# Patient Record
Sex: Male | Born: 1980 | Race: White | Hispanic: No | Marital: Married | Smoking: Never smoker
Health system: Southern US, Community
[De-identification: ages and names within clinical notes are randomized; demographics above are authoritative.]

---

## 2010-11-12 HISTORY — PX: VASECTOMY: SHX75

## 2011-08-17 ENCOUNTER — Ambulatory Visit: Payer: Self-pay | Admitting: Family Medicine

## 2011-10-06 ENCOUNTER — Ambulatory Visit: Payer: Self-pay | Admitting: Unknown Physician Specialty

## 2013-01-08 IMAGING — CR ORBITS FOR FOREIGN BODY - 2 VIEW
1 series · 3 of 3 positions shown · non-contrast
Comparison: none

REASON FOR EXAM: Hx of metal in eye - screen for residual fragments prior
to MRI
COMMENTS:

PROCEDURE:     DXR - DXR ORBITS FOR MRI CLEARANCE  - October 06, 2011  [DATE]
RESULT:     Comparison: None.

[Series 1: pa waters · 0.17mm/px · 3 of 3 slices shown]
[im 1/3]
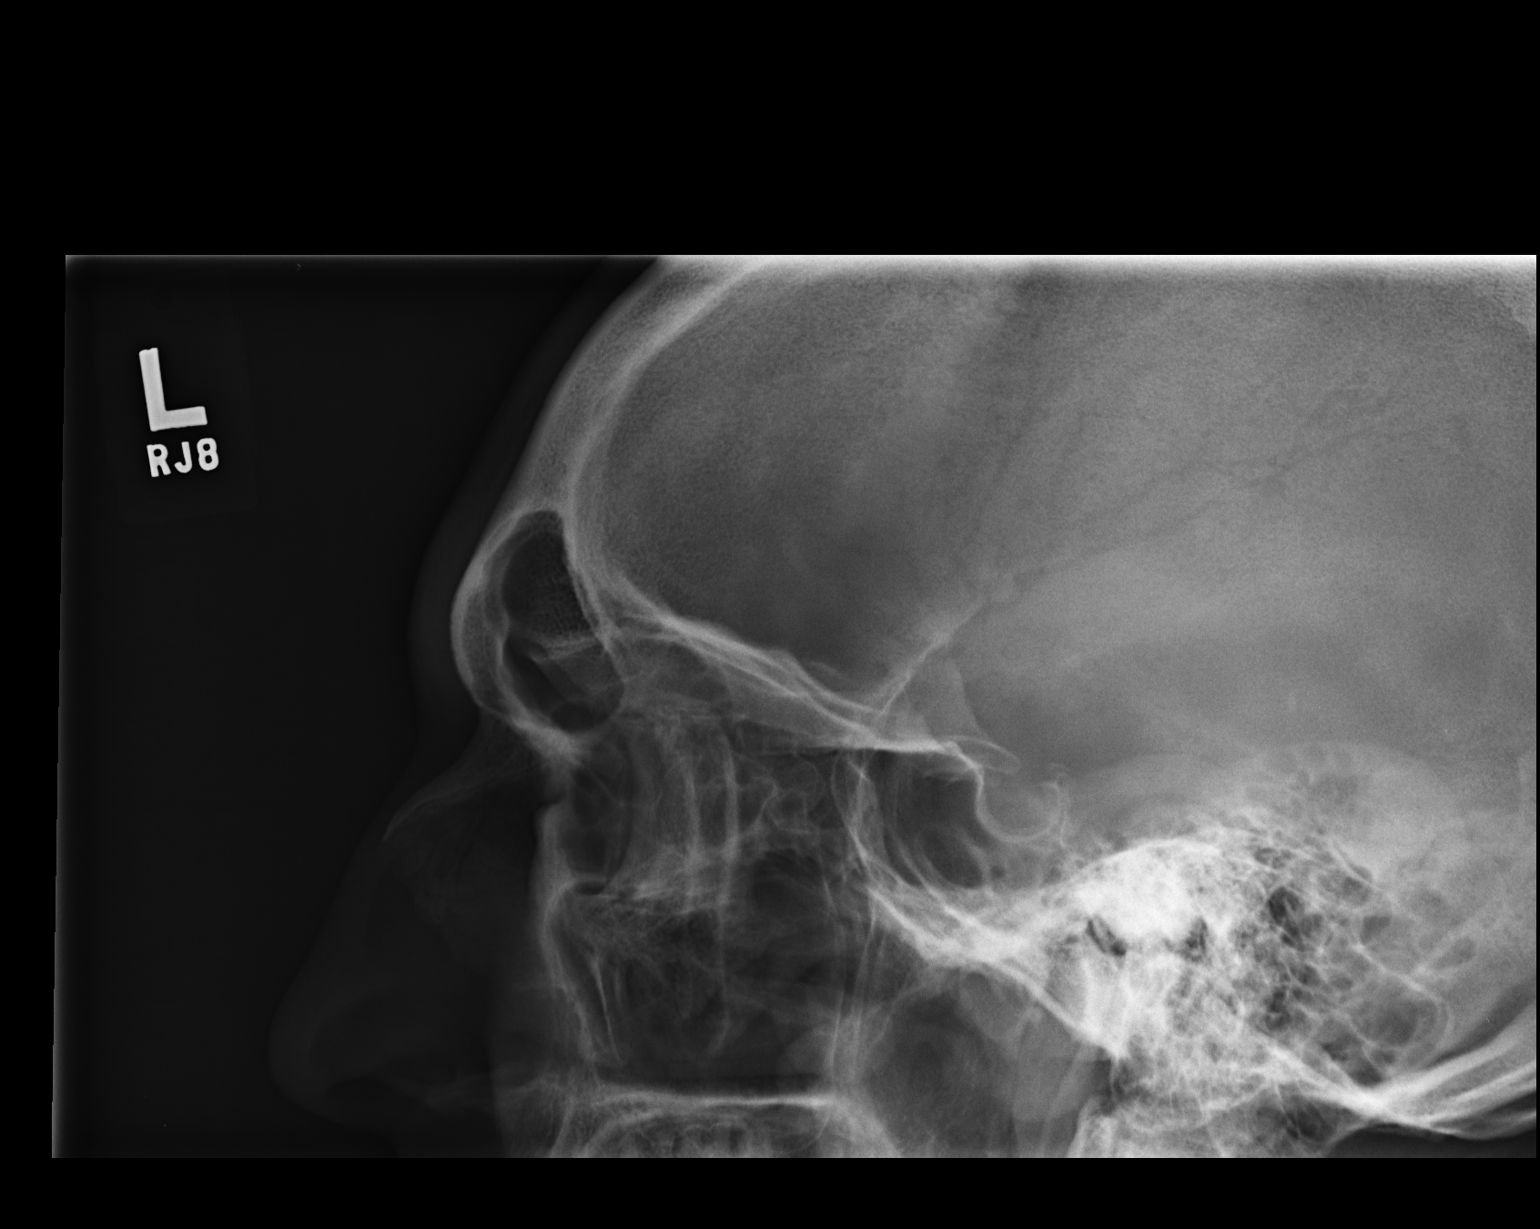
[im 2/3]
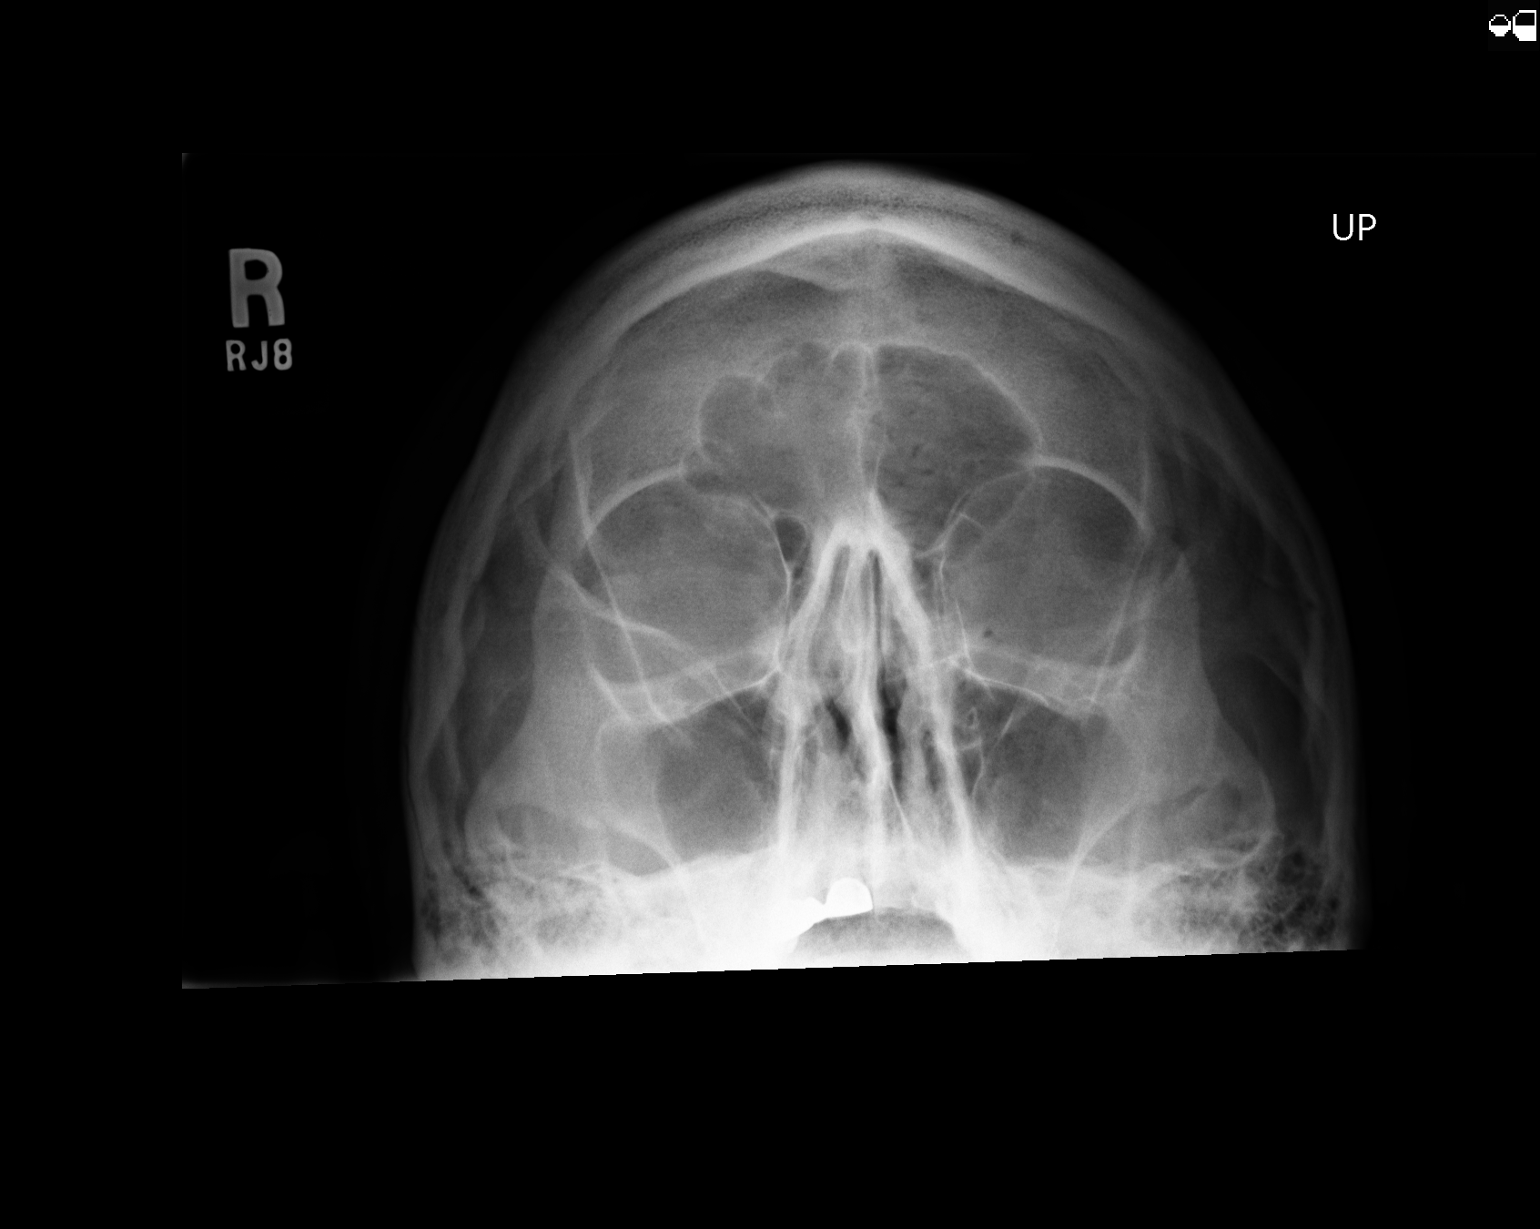
[im 3/3]
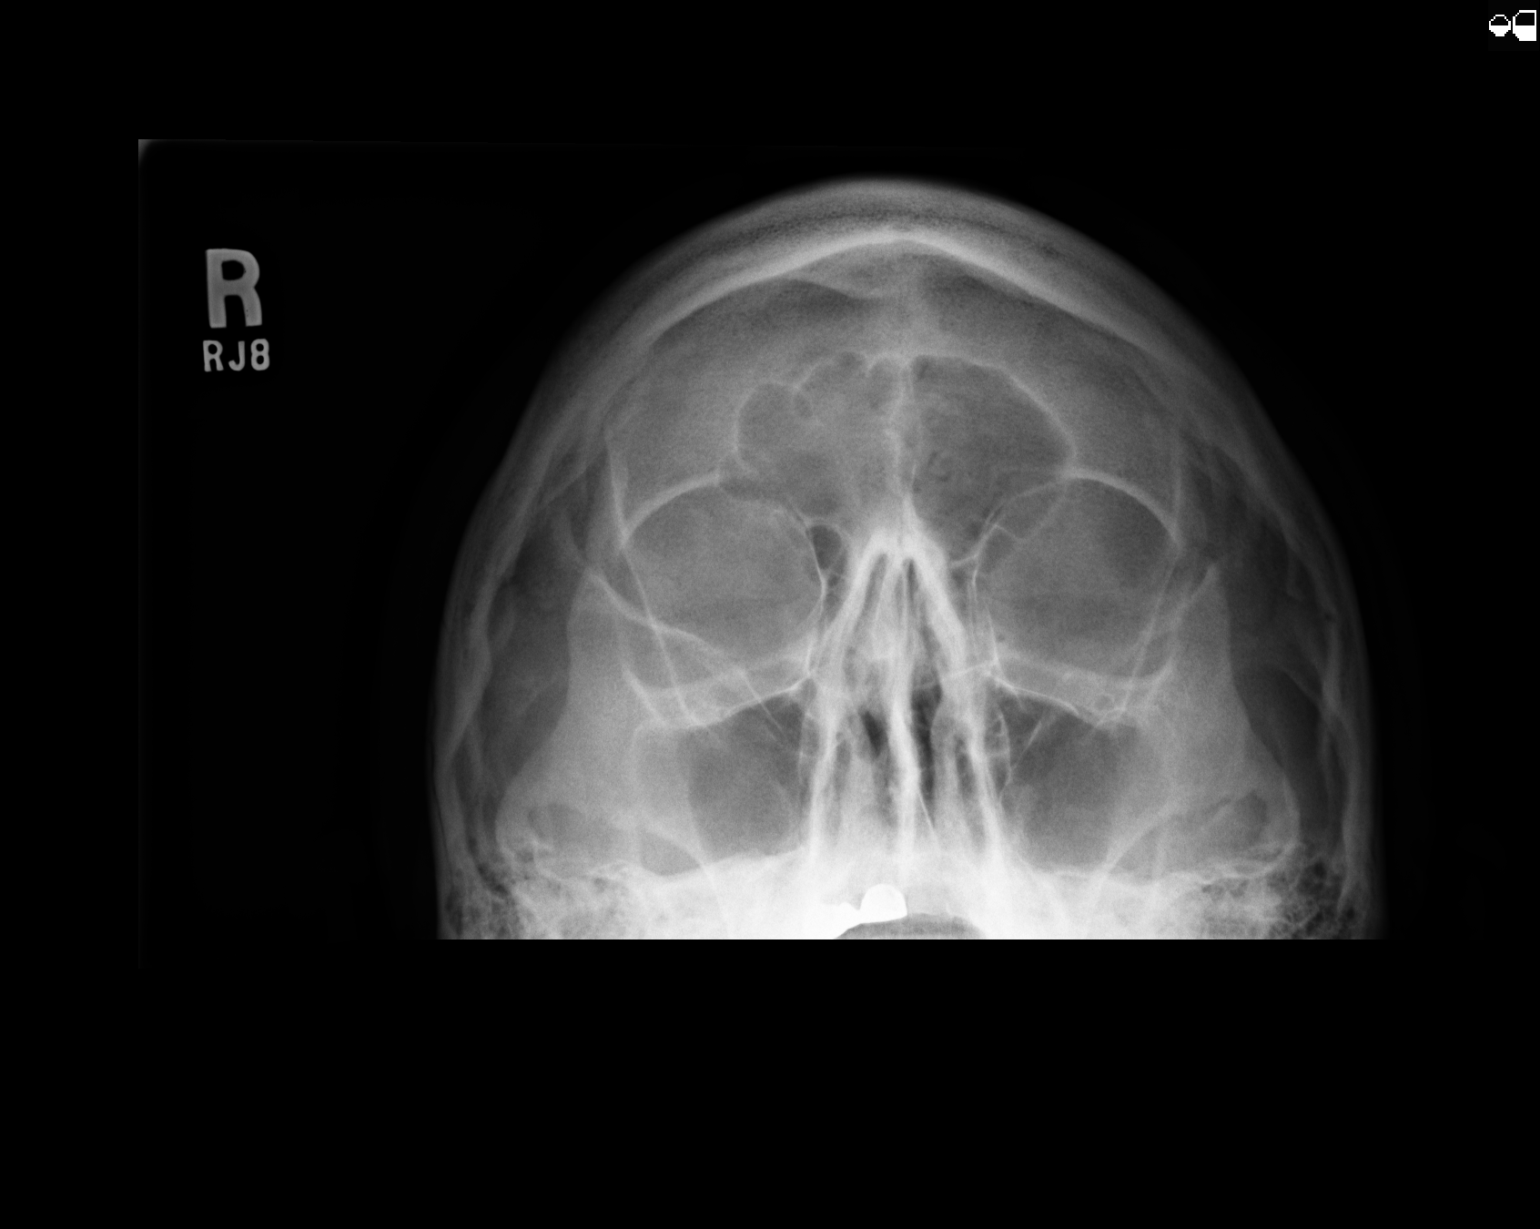

[3 of 3 positions shown; findings below may reference images not displayed]

FINDINGS: No metallic objects identified. No air-fluid levels within the
paranasal sinuses.
IMPRESSION: Please see above.

## 2013-05-19 IMAGING — CR SACRUM AND COCCYX - 2+ VIEW
1 series · 4 of 4 positions shown · non-contrast
Comparison: none

REASON FOR EXAM: pain
COMMENTS:

PROCEDURE:     KDR - KDXR SACRUM AND COCCYX  - August 17, 2011  [DATE]
RESULT:     AP and lateral views of the sacrum and coccyx were obtained. No
fracture or other acute bony abnormality is seen. No lytic or blastic
lesions are noted.

[Series 1: ap · 0.17mm/px · 4 of 4 slices shown]
[im 1/4]
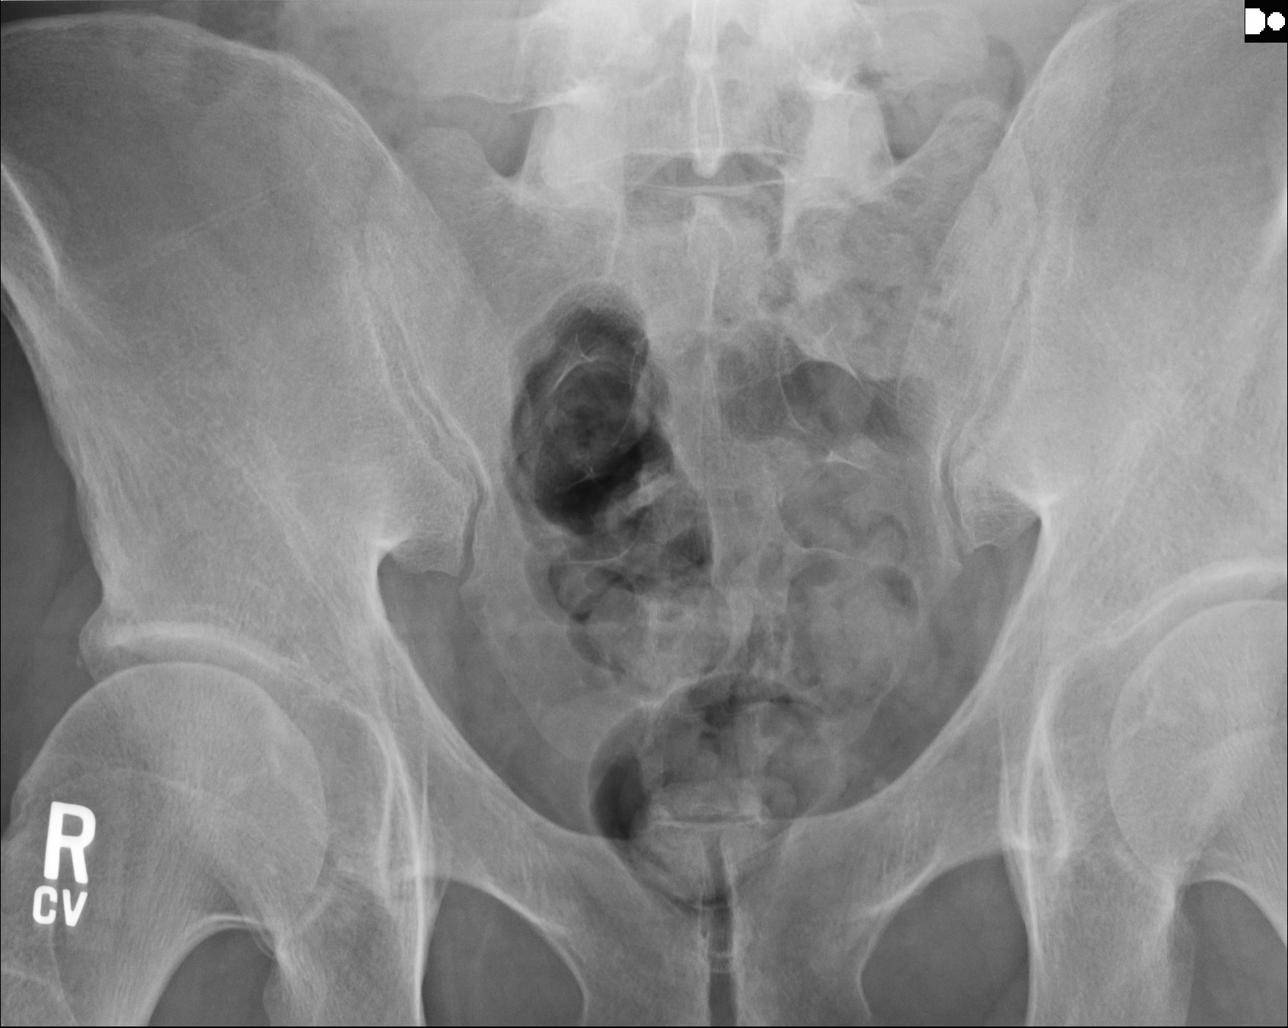
[im 2/4]
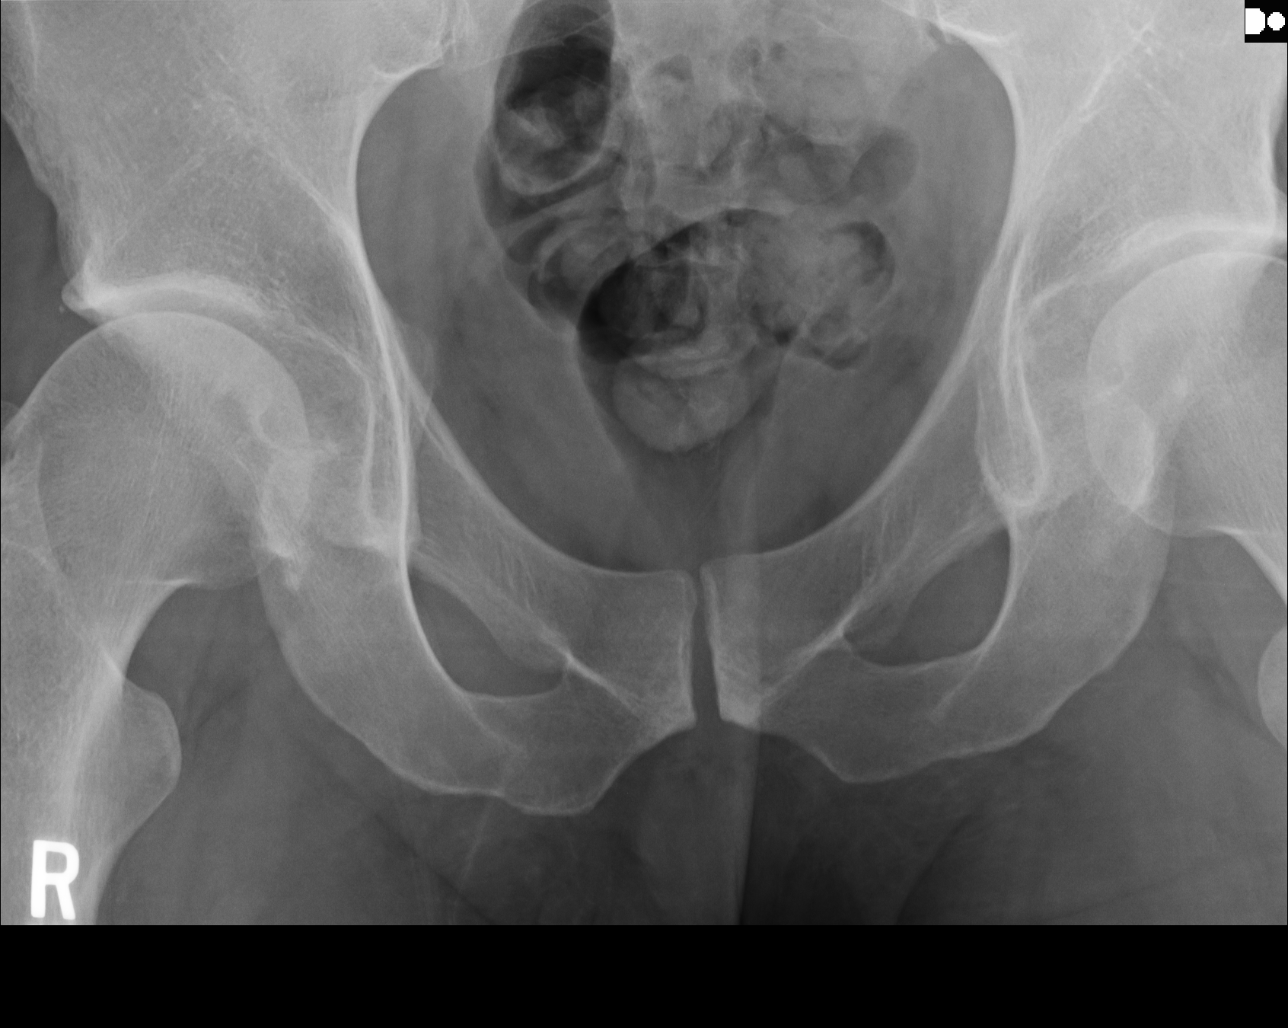
[im 3/4]
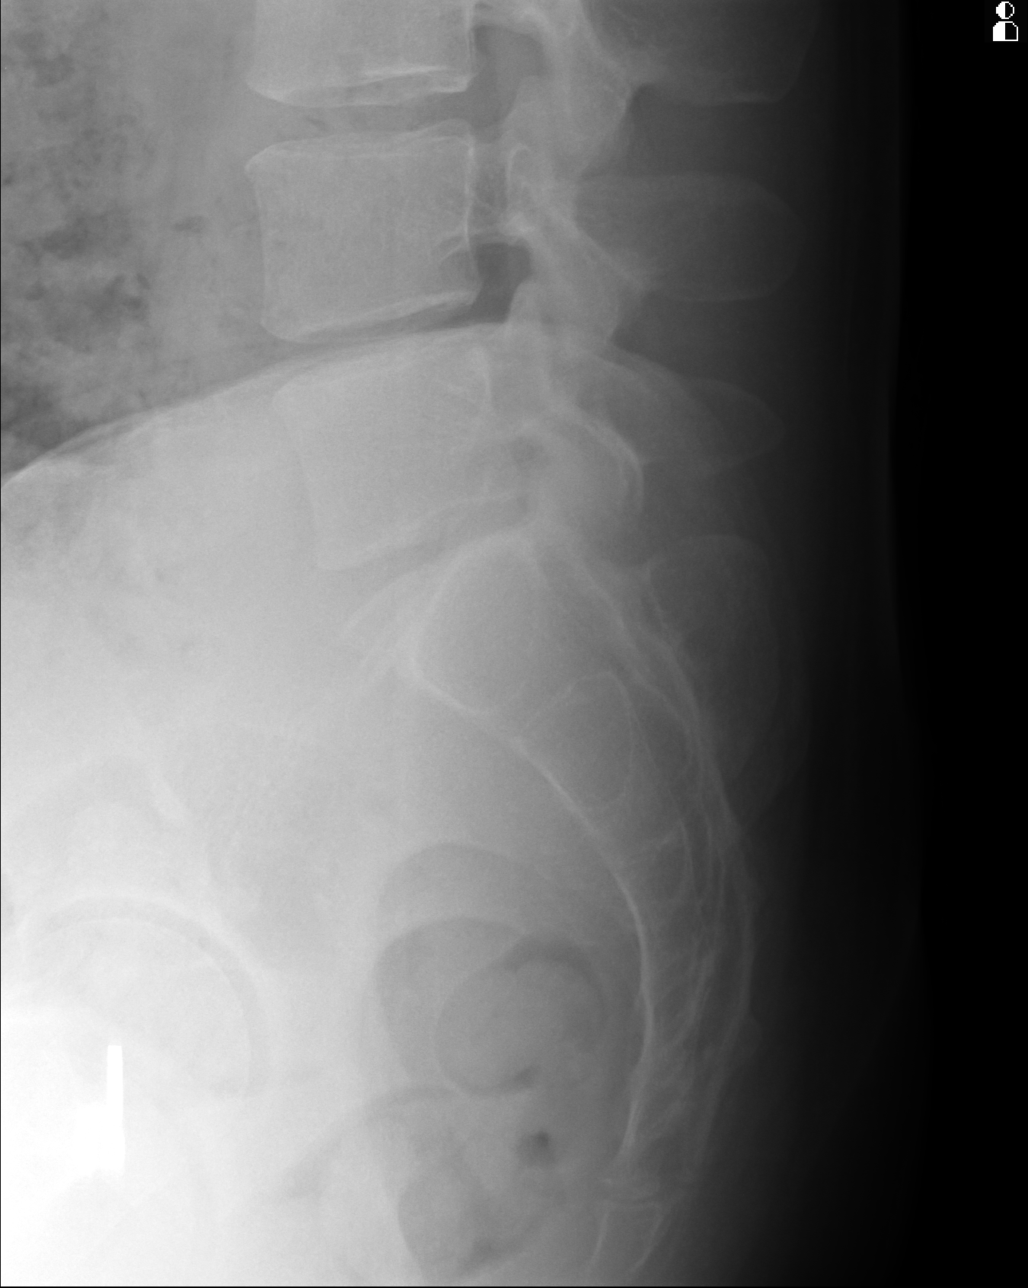
[im 4/4]
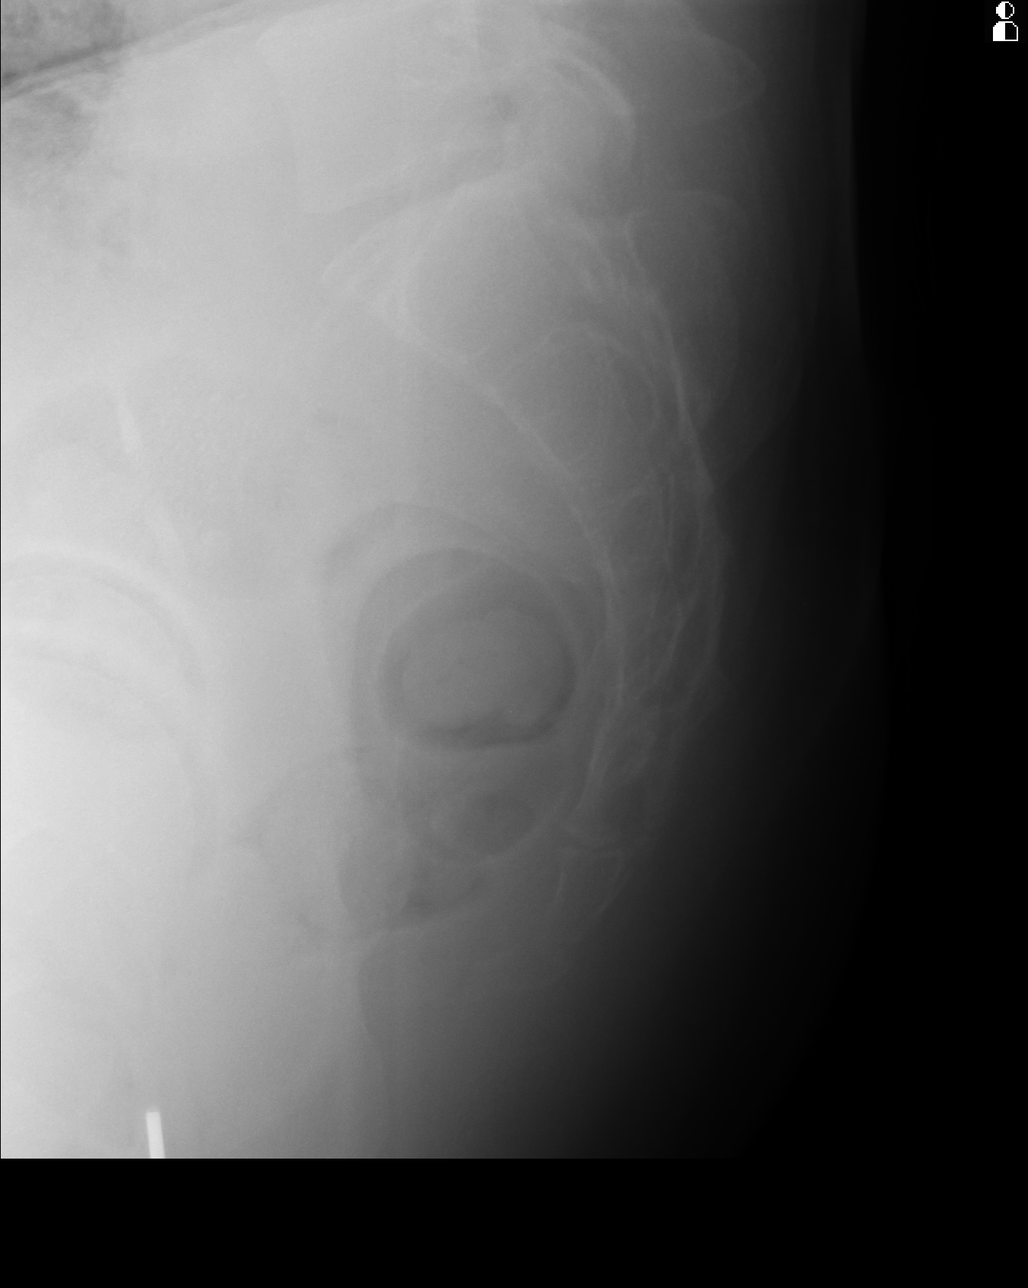

[4 of 4 positions shown; findings below may reference images not displayed]

IMPRESSION: 1.     No significant osseous abnormalities are noted.

## 2014-02-20 ENCOUNTER — Encounter: Payer: Self-pay | Admitting: *Deleted

## 2014-03-07 ENCOUNTER — Ambulatory Visit: Payer: Self-pay | Admitting: General Surgery

## 2014-03-19 ENCOUNTER — Ambulatory Visit (INDEPENDENT_AMBULATORY_CARE_PROVIDER_SITE_OTHER): Payer: 59 | Admitting: General Surgery

## 2014-03-19 ENCOUNTER — Encounter: Payer: Self-pay | Admitting: General Surgery

## 2014-03-19 VITALS — BP 138/100 | HR 86 | Resp 14 | Ht 75.0 in | Wt 239.0 lb

## 2014-03-19 DIAGNOSIS — D170 Benign lipomatous neoplasm of skin and subcutaneous tissue of head, face and neck: Secondary | ICD-10-CM

## 2014-03-19 NOTE — Patient Instructions (Signed)
Lipoma  A lipoma is a noncancerous (benign) tumor composed of fat cells. They are usually found under the skin (subcutaneous). A lipoma may occur in any tissue of the body that contains fat. Common areas for lipomas to appear include the back, shoulders, buttocks, and thighs. Lipomas are a very common soft tissue growth. They are soft and grow slowly. Most problems caused by a lipoma depend on where it is growing.  DIAGNOSIS   A lipoma can be diagnosed with a physical exam. These tumors rarely become cancerous, but radiographic studies can help determine this for certain. Studies used may include:  · Computerized X-ray scans (CT or CAT scan).  · Computerized magnetic scans (MRI).  TREATMENT   Small lipomas that are not causing problems may be watched. If a lipoma continues to enlarge or causes problems, removal is often the best treatment. Lipomas can also be removed to improve appearance. Surgery is done to remove the fatty cells and the surrounding capsule. Most often, this is done with medicine that numbs the area (local anesthetic). The removed tissue is examined under a microscope to make sure it is not cancerous. Keep all follow-up appointments with your caregiver.  SEEK MEDICAL CARE IF:   · The lipoma becomes larger or hard.  · The lipoma becomes painful, red, or increasingly swollen. These could be signs of infection or a more serious condition.  Document Released: 08/20/2002 Document Revised: 11/22/2011 Document Reviewed: 01/30/2010  ExitCare® Patient Information ©2015 [employer]. This information is not intended to replace advice given to you by your health care provider. Make sure you discuss any questions you have with your health care provider.

## 2014-03-19 NOTE — Progress Notes (Signed)
Patient ID: Brandon Serrano, male   DOB: [birth year], 33 y.o.   MRN: [id number]  Chief Complaint  Patient presents with  . Other    lipoma    HPI [name] is a 33 y.o. male here today for a evaluation of a lipoma on forehead and skin lesion on right thigh, left calf. Patient states they have  been there for about three years. No change noted No. pain .  HPI  History reviewed. No pertinent past medical history.  Past Surgical History  Procedure Laterality Date  . Vasectomy  11/2010    History reviewed. No pertinent family history.  Social History History  Substance Use Topics  . Smoking status: Never Smoker   . Smokeless tobacco: Never Used  . Alcohol Use: Yes    No Known Allergies  No current outpatient prescriptions on file.   No current facility-administered medications for this visit.    Review of Systems Review of Systems  Constitutional: Negative.   Respiratory: Negative.   Cardiovascular: Negative.     Blood pressure 138/100, pulse 86, resp. rate 14, height 6\' 3"  (1.905 m), weight 239 lb (108.41 kg).  Physical Exam Physical Exam  Constitutional: He is oriented to person, place, and time. He appears well-developed and well-nourished. No distress.  Neurological: He is alert and oriented to person, place, and time.  Skin: Skin is warm and dry.  2 cm soft, mobile mass over scalp of left frontal area. R distal anterior thigh, 5 mm pink raised fibrotic nodule.   L proximal calf, 1 cm pink, smooth fibrotic nodule    Data Reviewed none  Assessment    Lipoma of scalp, and skin lesions as noted above     Plan    Advised excision of lipoma of scalp.  In-office excision will be scheduled at conveniance of pt. Skin lesions have not changed over the years and are asymptomatic.  Nothing indicated at this time       [facility] 03/20/2014, 6:08 AM

## 2014-03-20 ENCOUNTER — Encounter: Payer: Self-pay | Admitting: General Surgery

## 2014-05-01 ENCOUNTER — Telehealth: Payer: Self-pay | Admitting: *Deleted

## 2014-05-01 NOTE — Telephone Encounter (Signed)
Patient's wife, Estill Bamberg, called the office yesterday wanting to know codes involved to have lipoma removed for insurance purposes.   Per Dr. Jamal Collin, the code would be 21012.   Patient's wife was notified of this today and verbalizes understanding. She will contact her insurance company and call the office back if they would like to arrange excision.

## 2014-05-27 ENCOUNTER — Encounter: Payer: Self-pay | Admitting: General Surgery

## 2014-05-27 ENCOUNTER — Ambulatory Visit (INDEPENDENT_AMBULATORY_CARE_PROVIDER_SITE_OTHER): Payer: 59 | Admitting: General Surgery

## 2014-05-27 VITALS — BP 120/80 | HR 80 | Resp 14 | Ht 75.0 in | Wt 240.0 lb

## 2014-05-27 DIAGNOSIS — D170 Benign lipomatous neoplasm of skin and subcutaneous tissue of head, face and neck: Secondary | ICD-10-CM

## 2014-05-27 NOTE — Progress Notes (Deleted)
Patient ID: Brandon Serrano, male   DOB: 11-23-1980, 33 y.o.   MRN: [id number]  Chief Complaint  Patient presents with  . Follow-up    lipoma on forehead    HPI Brandon Serrano is a 33 y.o. male who presents for an evaluation of a lipoma on his forehead. He states the area has been there for approximately 3 years. He states it has not gotten any larger since we seen him last. He does have occasional pain that comes and goes.   HPI  History reviewed. No pertinent past medical history.  Past Surgical History  Procedure Laterality Date  . Vasectomy  11/2010    History reviewed. No pertinent family history.  Social History History  Substance Use Topics  . Smoking status: Never Smoker   . Smokeless tobacco: Never Used  . Alcohol Use: Yes    No Known Allergies  No current outpatient prescriptions on file.   No current facility-administered medications for this visit.    Review of Systems Review of Systems  Constitutional: Negative.   Respiratory: Negative.   Cardiovascular: Negative.     Blood pressure 120/80, pulse 80, resp. rate 14, height 6\' 3"  (1.905 m), weight 240 lb (108.863 kg).  Physical Exam Physical Exam  Data Reviewed ***  Assessment    ***    Plan    ***       Zoe Lan 05/27/2014, 1:36 PM

## 2014-05-27 NOTE — Progress Notes (Signed)
Patient ID: Brandon Serrano, male   DOB: 01-04-1981, 33 y.o.   MRN: [id number]  The patient present for an excision of mass on his scalp. No changes since his last visit.  Procedure: Excision subcutaneous lipoma scalp  Anesthetic: 73ml 1% xylocaine mixed with 0.5% marcaine.  Area prepped with Chloroprep and draped.Transverse 2cm incision made and deepened through the skin  2cm well circumscribed lipoma was excised out. Bleeding controlled with cautery. Incision closed with interrupted 4-0 Nylon. Dressed with neosporin oint and band aid No immediate problems from procedure.

## 2014-05-27 NOTE — Patient Instructions (Signed)
Patient to return in 1 week for nurse visit to have sutures removed. The patient is aware to call back for any questions or concerns.

## 2014-05-28 ENCOUNTER — Encounter: Payer: Self-pay | Admitting: General Surgery

## 2014-05-30 LAB — PATHOLOGY

## 2014-05-31 ENCOUNTER — Ambulatory Visit (INDEPENDENT_AMBULATORY_CARE_PROVIDER_SITE_OTHER): Payer: Self-pay | Admitting: *Deleted

## 2014-05-31 DIAGNOSIS — D170 Benign lipomatous neoplasm of skin and subcutaneous tissue of head, face and neck: Secondary | ICD-10-CM

## 2014-05-31 NOTE — Patient Instructions (Signed)
The patient is aware to call back for any questions or concerns.  

## 2014-05-31 NOTE — Progress Notes (Signed)
The sutures were removed and steri strips applied. Patient advised of pathology results. The area is clean and healing well.

## 2014-06-03 ENCOUNTER — Telehealth: Payer: Self-pay | Admitting: *Deleted

## 2014-06-03 NOTE — Telephone Encounter (Signed)
Message copied by Chrystie Nose on Mon Jun 03, 2014  8:15 AM ------      Message from: Christene Lye      Created: Thu May 30, 2014  4:10 PM       Please let pt pt know the pathology was normal. ------

## 2014-06-03 NOTE — Telephone Encounter (Signed)
Notified patient as instructed, patient pleased. Discussed follow-up appointments, patient agrees  

## 2014-06-03 NOTE — Telephone Encounter (Signed)
Notified patient of results 

## 2015-06-30 ENCOUNTER — Encounter (INDEPENDENT_AMBULATORY_CARE_PROVIDER_SITE_OTHER): Payer: Self-pay | Admitting: Family Medicine

## 2015-06-30 DIAGNOSIS — Z021 Encounter for pre-employment examination: Secondary | ICD-10-CM

## 2015-06-30 NOTE — Progress Notes (Unsigned)
[employer] Class 2 Flight PE  done

## 2015-10-13 ENCOUNTER — Ambulatory Visit (INDEPENDENT_AMBULATORY_CARE_PROVIDER_SITE_OTHER): Payer: 59 | Admitting: Family Medicine

## 2015-10-13 ENCOUNTER — Encounter: Payer: Self-pay | Admitting: Family Medicine

## 2015-10-13 VITALS — BP 140/84 | HR 82 | Temp 99.0°F | Resp 16 | Wt 256.0 lb

## 2015-10-13 DIAGNOSIS — J0100 Acute maxillary sinusitis, unspecified: Secondary | ICD-10-CM

## 2015-10-13 LAB — POCT INFLUENZA A/B
INFLUENZA A, POC: NEGATIVE
Influenza B, POC: NEGATIVE

## 2015-10-13 LAB — POCT RAPID STREP A (OFFICE): RAPID STREP A SCREEN: NEGATIVE

## 2015-10-13 MED ORDER — AMOXICILLIN 500 MG PO CAPS: 1000.0000 mg | ORAL_CAPSULE | Freq: Two times a day (BID) | ORAL | Status: AC

## 2015-10-13 NOTE — Addendum Note (Signed)
Addended by: Julieta Bellini on: 10/13/2015 03:40 PM   Modules accepted: Orders, SmartSet

## 2015-10-13 NOTE — Progress Notes (Signed)
Patient: Brandon Serrano Male    DOB: [birth year]   35 y.o.   MRN: [id number] Visit Date: 10/13/2015  Today's Provider: Lelon Huh, MD   Chief Complaint  Patient presents with  . Cough  . Sore Throat   Subjective:    Cough This is a recurrent problem. The current episode started 1 to 4 weeks ago (1 month ago). The problem has been gradually worsening. The problem occurs constantly. The cough is productive of sputum. Associated symptoms include chest pain, chills, myalgias, nasal congestion, postnasal drip, a rash, rhinorrhea and a sore throat. Pertinent negatives include no ear congestion, ear pain, fever, headaches, heartburn, hemoptysis, shortness of breath, sweats, weight loss or wheezing. Nothing aggravates the symptoms. He has tried OTC cough suppressant (mucinex and dayquil) for the symptoms. The treatment provided mild relief. His past medical history is significant for asthma and environmental allergies. There is no history of bronchiectasis, bronchitis, COPD or pneumonia.  Sore Throat  This is a recurrent problem. The current episode started 1 to 4 weeks ago. The problem has been gradually worsening. Neither side of throat is experiencing more pain than the other. The pain is moderate. Associated symptoms include congestion, coughing and a hoarse voice. Pertinent negatives include no abdominal pain, diarrhea, drooling, ear discharge, ear pain, headaches, plugged ear sensation, shortness of breath, stridor, swollen glands, trouble swallowing or vomiting. He has had no exposure to strep or mono. Treatments tried: mucinex and dayquil. The treatment provided mild relief.    Patient had sore throat and cough the first of January. Symptoms went away after a week. Sore throat and cough started again  10/10/2015. A few days after rash like bumps appeared on different parts of his body. Rash is itchy and red.   No Known Allergies Previous Medications   No medications on file     Review of Systems  Constitutional: Positive for chills. Negative for fever, weight loss and appetite change.  HENT: Positive for congestion, hoarse voice, postnasal drip, rhinorrhea, sinus pressure and sore throat. Negative for drooling, ear discharge, ear pain and trouble swallowing.   Respiratory: Positive for cough. Negative for hemoptysis, chest tightness, shortness of breath, wheezing and stridor.   Cardiovascular: Positive for chest pain. Negative for palpitations.  Gastrointestinal: Negative for heartburn, nausea, vomiting, abdominal pain and diarrhea.  Musculoskeletal: Positive for myalgias.  Skin: Positive for rash.  Allergic/Immunologic: Positive for environmental allergies.  Neurological: Negative for headaches.    Social History  Substance Use Topics  . Smoking status: Never Smoker   . Smokeless tobacco: Never Used  . Alcohol Use: Yes   Objective:   BP 140/84 mmHg  Pulse 82  Temp(Src) 99 F (37.2 C) (Oral)  Resp 16  Wt 256 lb (116.121 kg)  SpO2 100%  Physical Exam   General Appearance:    Alert, cooperative, no distress  HENT:   bilateral TM normal without fluid or infection, neck without nodes, maxillary sinus tender and nasal mucosa pale and congested  Eyes:    PERRL, conjunctiva/corneas clear, EOM's intact       Lungs:     Clear to auscultation bilaterally, respirations unlabored  Heart:    Regular rate and rhythm  Neurologic:   Awake, alert, oriented x 3. No apparent focal neurological           defect.        Flu a/b negative Strep Negative.      Assessment & Plan:  1. Acute maxillary sinusitis, recurrence not specified  - amoxicillin (AMOXIL) 500 MG capsule; Take 2 capsules (1,000 mg total) by mouth 2 (two) times daily.  Dispense: 40 capsule; Refill: 0   Call if symptoms change or if not rapidly improving.          Lelon Huh, MD  [facility]

## 2016-04-22 ENCOUNTER — Encounter: Payer: Self-pay | Admitting: Family Medicine

## 2016-04-22 ENCOUNTER — Ambulatory Visit (INDEPENDENT_AMBULATORY_CARE_PROVIDER_SITE_OTHER): Payer: 59 | Admitting: Family Medicine

## 2016-04-22 VITALS — BP 124/84 | HR 75 | Temp 98.0°F | Resp 15 | Wt 241.0 lb

## 2016-04-22 DIAGNOSIS — J0100 Acute maxillary sinusitis, unspecified: Secondary | ICD-10-CM | POA: Diagnosis not present

## 2016-04-22 DIAGNOSIS — H109 Unspecified conjunctivitis: Secondary | ICD-10-CM

## 2016-04-22 MED ORDER — SULFACETAMIDE SODIUM 10 % OP SOLN: 2.0000 [drp] | Freq: Four times a day (QID) | OPHTHALMIC | 0 refills | Status: DC

## 2016-04-22 MED ORDER — AMOXICILLIN-POT CLAVULANATE 875-125 MG PO TABS: 1.0000 | ORAL_TABLET | Freq: Two times a day (BID) | ORAL | 0 refills | Status: DC

## 2016-04-22 NOTE — Patient Instructions (Signed)
Discussed use of frequent warm compresses to the right eye.

## 2016-04-22 NOTE — Progress Notes (Signed)
Subjective:     Patient ID: Brandon Serrano, male   DOB: [birth year], 35 y.o.   MRN: [id number]  HPI  Chief Complaint  Patient presents with  . Sinus Problem    Patient comes in office today with complaints of sinus pain and pressure for the past week. Patient states that he was at [place] swimming when symptoms initially started. Patient complains of pain on the right side of his face only and for the past two days has begun having pain in his right eye. Patient states that his eye is red, blurred and watery.   States he was in [place] in [place] water and got a lot in his sinuses. Reports PND of greenish appearing drainage. States his vision is intact but more blurry than usual. Does not wear contacts. Has two young children at home who are not ill.   Review of Systems     Objective:   Physical Exam  Constitutional: He appears well-developed and well-nourished. No distress.  Ears: T.M's intact without inflammation Eyes: Eyes PERLA, scleral injection in inferior aspect, no f.b visualized. Sinuses: mild right maxillary sinus tenderness Throat: no tonsillar enlargement or exudate Neck: no cervical adenopathy Lungs: clear     Assessment:    1. Conjunctivitis of right eye - sulfacetamide (BLEPH-10) 10 % ophthalmic solution; Place 2 drops into the right eye 4 (four) times daily.  Dispense: 15 mL; Refill: 0  2. Acute maxillary sinusitis, recurrence not specified - amoxicillin-clavulanate (AUGMENTIN) 875-125 MG tablet; Take 1 tablet by mouth 2 (two) times daily.  Dispense: 20 tablet; Refill: 0    Plan:    Discussed use of warm compresses.

## 2017-10-25 ENCOUNTER — Encounter: Payer: Self-pay | Admitting: Family Medicine

## 2017-10-25 ENCOUNTER — Ambulatory Visit: Payer: Managed Care, Other (non HMO) | Admitting: Family Medicine

## 2017-10-25 VITALS — BP 140/94 | HR 93 | Temp 99.6°F | Resp 16 | Wt 235.0 lb

## 2017-10-25 DIAGNOSIS — J069 Acute upper respiratory infection, unspecified: Secondary | ICD-10-CM

## 2017-10-25 DIAGNOSIS — R040 Epistaxis: Secondary | ICD-10-CM | POA: Diagnosis not present

## 2017-10-25 MED ORDER — GUAIFENESIN-CODEINE 100-10 MG/5ML PO SOLN: 5.0000 mL | Freq: Four times a day (QID) | ORAL | 0 refills | Status: DC | PRN

## 2017-10-25 MED ORDER — AMOXICILLIN 875 MG PO TABS: 875.0000 mg | ORAL_TABLET | Freq: Two times a day (BID) | ORAL | 0 refills | Status: DC

## 2017-10-25 NOTE — Patient Instructions (Addendum)
Nosebleed, Adult A nosebleed is when blood comes out of the nose. Nosebleeds are common. Usually, they are not a sign of a serious condition. Nosebleeds can happen if a small blood vessel in your nose starts to bleed or if the lining of your nose (mucous membrane) cracks. They are commonly caused by:  Allergies.  Colds.  Picking your nose.  Blowing your nose too hard.  An injury from sticking an object into your nose or getting hit in the nose.  Dry or cold air.  Less common causes of nosebleeds include:  Toxic fumes.  Something abnormal in the nose or in the air-filled spaces in the bones of the face (sinuses).  Growths in the nose, such as polyps.  Medicines or conditions that cause blood to clot slowly.  Certain illnesses or procedures that irritate or dry out the nasal passages.  Follow these instructions at home: When you have a nosebleed:  Sit down and tilt your head slightly forward.  Use a clean towel or tissue to pinch your nostrils under the bony part of your nose. After 10 minutes, let go of your nose and see if bleeding starts again. Do not release pressure before that time. If there is still bleeding, repeat the pinching and holding for 10 minutes until the bleeding stops.  Do not place tissues or gauze in the nose to stop bleeding.  Avoid lying down and avoid tilting your head backward. That may make blood collect in the throat and cause gagging or coughing.  Use a nasal spray decongestant to help with a nosebleed as told by your health care provider.  Do not use petroleum jelly or mineral oil in your nose. It can drip into your lungs. After a nosebleed:  Avoid blowing your nose or sniffing for a number of hours.  Avoid straining, lifting, or bending at the waist for several days. You may resume other normal activities as you are able.  Use saline spray or a humidifier as told by your health care provider.  Aspirinand blood thinners make bleeding more  likely. If you are prescribed these medicines and you suffer from nosebleeds: ? Ask your health care provider if you should stop taking the medicines or if you should adjust the dose. ? Do not stop taking medicines that your health care provider has recommended unless told by your health care provider.  If your nosebleed was caused by dry mucous membranes, use over-the-counter saline nasal spray or gel. This will keep the mucous membranes moist and allow them to heal. If you must use a lubricant: ? Choose one that is water-soluble. ? Use only as much as you need and use it only as often as needed. ? Do not lie down until several hours after you use it. Contact a health care provider if:  You have a fever.  You get nosebleeds often or more often than usual.  You bruise very easily.  You have a nosebleed from having something stuck in your nose.  You have bleeding in your mouth.  You vomit or cough up brown material.  You have a nosebleed after you start a new medicine. Get help right away if:  You have a nosebleed after a fall or a head injury.  Your nosebleed does not go away after 20 minutes.  You feel dizzy or weak.  You have unusual bleeding from other parts of your body.  You have unusual bruising on other parts of your body.  You become sweaty.    You vomit blood. This information is not intended to replace advice given to you by your health care provider. Make sure you discuss any questions you have with your health care provider. Document Released: 06/09/2005 Document Revised: 04/29/2016 Document Reviewed: 03/16/2016 Elsevier Interactive Patient Education  2018 [place]. Upper Respiratory Infection, Adult Most upper respiratory infections (URIs) are caused by a virus. A URI affects the nose, throat, and upper air passages. The most common type of URI is often called "the common cold." Follow these instructions at home:  Take medicines only as told by your  doctor.  Gargle warm saltwater or take cough drops to comfort your throat as told by your doctor.  Use a warm mist humidifier or inhale steam from a shower to increase air moisture. This may make it easier to breathe.  Drink enough fluid to keep your pee (urine) clear or pale yellow.  Eat soups and other clear broths.  Have a healthy diet.  Rest as needed.  Go back to work when your fever is gone or your doctor says it is okay. ? You may need to stay home longer to avoid giving your URI to others. ? You can also wear a face mask and wash your hands often to prevent spread of the virus.  Use your inhaler more if you have asthma.  Do not use any tobacco products, including cigarettes, chewing tobacco, or electronic cigarettes. If you need help quitting, ask your doctor. Contact a doctor if:  You are getting worse, not better.  Your symptoms are not helped by medicine.  You have chills.  You are getting more short of breath.  You have brown or red mucus.  You have yellow or brown discharge from your nose.  You have pain in your face, especially when you bend forward.  You have a fever.  You have puffy (swollen) neck glands.  You have pain while swallowing.  You have white areas in the back of your throat. Get help right away if:  You have very bad or constant: ? Headache. ? Ear pain. ? Pain in your forehead, behind your eyes, and over your cheekbones (sinus pain). ? Chest pain.  You have long-lasting (chronic) lung disease and any of the following: ? Wheezing. ? Long-lasting cough. ? Coughing up blood. ? A change in your usual mucus.  You have a stiff neck.  You have changes in your: ? Vision. ? Hearing. ? Thinking. ? Mood. This information is not intended to replace advice given to you by your health care provider. Make sure you discuss any questions you have with your health care provider. Document Released: 02/16/2008 Document Revised: 05/02/2016  Document Reviewed: 12/05/2013 Elsevier Interactive Patient Education  2018 [place], Adult A nosebleed is when blood comes out of the nose. Nosebleeds are common. Usually, they are not a sign of a serious condition. Nosebleeds can happen if a small blood vessel in your nose starts to bleed or if the lining of your nose (mucous membrane) cracks. They are commonly caused by:  Allergies.  Colds.  Picking your nose.  Blowing your nose too hard.  An injury from sticking an object into your nose or getting hit in the nose.  Dry or cold air.  Less common causes of nosebleeds include:  Toxic fumes.  Something abnormal in the nose or in the air-filled spaces in the bones of the face (sinuses).  Growths in the nose, such as polyps.  Medicines or conditions that cause  blood to clot slowly.  Certain illnesses or procedures that irritate or dry out the nasal passages.  Follow these instructions at home: When you have a nosebleed:  Sit down and tilt your head slightly forward.  Use a clean towel or tissue to pinch your nostrils under the bony part of your nose. After 10 minutes, let go of your nose and see if bleeding starts again. Do not release pressure before that time. If there is still bleeding, repeat the pinching and holding for 10 minutes until the bleeding stops.  Do not place tissues or gauze in the nose to stop bleeding.  Avoid lying down and avoid tilting your head backward. That may make blood collect in the throat and cause gagging or coughing.  Use a nasal spray decongestant to help with a nosebleed as told by your health care provider.  Do not use petroleum jelly or mineral oil in your nose. It can drip into your lungs. After a nosebleed:  Avoid blowing your nose or sniffing for a number of hours.  Avoid straining, lifting, or bending at the waist for several days. You may resume other normal activities as you are able.  Use saline spray or a  humidifier as told by your health care provider.  Aspirinand blood thinners make bleeding more likely. If you are prescribed these medicines and you suffer from nosebleeds: ? Ask your health care provider if you should stop taking the medicines or if you should adjust the dose. ? Do not stop taking medicines that your health care provider has recommended unless told by your health care provider.  If your nosebleed was caused by dry mucous membranes, use over-the-counter saline nasal spray or gel. This will keep the mucous membranes moist and allow them to heal. If you must use a lubricant: ? Choose one that is water-soluble. ? Use only as much as you need and use it only as often as needed. ? Do not lie down until several hours after you use it. Contact a health care provider if:  You have a fever.  You get nosebleeds often or more often than usual.  You bruise very easily.  You have a nosebleed from having something stuck in your nose.  You have bleeding in your mouth.  You vomit or cough up brown material.  You have a nosebleed after you start a new medicine. Get help right away if:  You have a nosebleed after a fall or a head injury.  Your nosebleed does not go away after 20 minutes.  You feel dizzy or weak.  You have unusual bleeding from other parts of your body.  You have unusual bruising on other parts of your body.  You become sweaty.  You vomit blood. This information is not intended to replace advice given to you by your health care provider. Make sure you discuss any questions you have with your health care provider. Document Released: 06/09/2005 Document Revised: 04/29/2016 Document Reviewed: 03/16/2016 Elsevier Interactive Patient Education  [employer].

## 2017-10-25 NOTE — Progress Notes (Signed)
Patient ID: LEBRON NAUERT, male   DOB: 19-May-1981, 37 y.o.   MRN: [id number]       Patient: Brandon Serrano Male    DOB: March 19, 1981   37 y.o.   MRN: [id number] Visit Date: 10/25/2017  Today's Provider: Vernie Murders, PA   Chief Complaint  Patient presents with  . Cough  . Back Pain   Subjective:    Cough  This is a new problem. The current episode started in the past 7 days. The problem has been unchanged. The problem occurs constantly. The cough is productive of sputum. Associated symptoms include ear congestion, nasal congestion, postnasal drip, rhinorrhea and a sore throat. Pertinent negatives include no chest pain, chills, ear pain, fever, headaches, heartburn, hemoptysis, myalgias, rash, shortness of breath, sweats, weight loss or wheezing. Nothing aggravates the symptoms. He has tried OTC cough suppressant for the symptoms. The treatment provided no relief.  Back Pain  This is a new problem. The current episode started yesterday. The problem is unchanged. The pain is present in the lumbar spine. The quality of the pain is described as aching. The pain does not radiate. The pain is at a severity of 3/10. The pain is the same all the time. Pertinent negatives include no chest pain, fever, headaches or weight loss. He has tried nothing for the symptoms.   History reviewed. No pertinent past medical history.  Past Surgical History:  Procedure Laterality Date  . VASECTOMY  11/2010   History reviewed. No pertinent family history.  No Known Allergies  No current outpatient medications on file.  Review of Systems  Constitutional: Negative for chills, fever and weight loss.  HENT: Positive for postnasal drip, rhinorrhea and sore throat. Negative for ear pain.   Respiratory: Positive for cough. Negative for hemoptysis, shortness of breath and wheezing.   Cardiovascular: Negative for chest pain.  Gastrointestinal: Negative for heartburn.  Musculoskeletal: Positive for back pain.  Negative for myalgias.  Skin: Negative for rash.  Neurological: Negative for headaches.   Social History   Tobacco Use  . Smoking status: Never Smoker  . Smokeless tobacco: Never Used  Substance Use Topics  . Alcohol use: Yes   Objective:   BP (!) 140/94   Pulse 93   Temp 99.6 F (37.6 C) (Oral)   Resp 16   Wt 235 lb (106.6 kg)   SpO2 98%   BMI 28.83 kg/m  Vitals:   10/25/17 1438  BP: (!) 140/94  Pulse: 93  Resp: 16  Temp: 99.6 F (37.6 C)  TempSrc: Oral  SpO2: 98%  Weight: 235 lb (106.6 kg)   Physical Exam  Constitutional: He is oriented to person, place, and time. He appears well-developed and well-nourished. No distress.  HENT:  Head: Normocephalic and atraumatic.  Right Ear: Hearing and external ear normal.  Left Ear: Hearing and external ear normal.  Nose: Nose normal.  Slightly cobblestone appearance to posterior pharynx. Epistaxis from anterior septal wall bilaterally. No sinus tenderness.  Eyes: Conjunctivae and lids are normal. Right eye exhibits no discharge. Left eye exhibits no discharge. No scleral icterus.  Neck: Neck supple.  Cardiovascular: Normal rate and regular rhythm.  Pulmonary/Chest: Effort normal and breath sounds normal. No respiratory distress.  Abdominal: Soft. Bowel sounds are normal.  Musculoskeletal:  Lumbar musculature soreness with fair ROM. No radiation of pain or numbness.  Lymphadenopathy:    He has no cervical adenopathy.  Neurological: He is alert and oriented to person, place, and time.  Skin: Skin  is intact. No lesion and no rash noted.  Psychiatric: He has a normal mood and affect. His speech is normal and behavior is normal. Thought content normal.      Assessment & Plan:     1. URI with cough and congestion Onset 4 days ago with stuffy head, congestion and PND. Low grade fever and some purulent sputum intermittently. No blood in pharynx or sputum. Treat with Robitussin-AC for cough, Amoxicillin for infection and  increase fluid intake. May use Tylenol prn aches, pains or fever. No ASA due to nose bleed. Recheck if no better in 5-6 days. - amoxicillin (AMOXIL) 875 MG tablet; Take 1 tablet (875 mg total) by mouth 2 (two) times daily.  Dispense: 20 tablet; Refill: 0 - guaiFENesin-codeine 100-10 MG/5ML syrup; Take 5 mLs by mouth every 6 (six) hours as needed for cough.  Dispense: 120 mL; Refill: 0  2. Epistaxis Onset with cough and congestion today. Apply pinch to the nose and may use Afrin BID for the next 3 days. Recheck prn or go to ER if unable to get bleeding to stop.       Vernie Murders, PA  [facility]

## 2018-11-22 ENCOUNTER — Encounter: Payer: Self-pay | Admitting: Physician Assistant

## 2018-11-22 ENCOUNTER — Ambulatory Visit: Payer: Managed Care, Other (non HMO) | Admitting: Physician Assistant

## 2018-11-22 VITALS — BP 136/82 | HR 82 | Temp 98.4°F | Resp 16 | Wt 235.0 lb

## 2018-11-22 DIAGNOSIS — M545 Low back pain, unspecified: Secondary | ICD-10-CM

## 2018-11-22 MED ORDER — NAPROXEN 500 MG PO TABS: 500.0000 mg | ORAL_TABLET | Freq: Two times a day (BID) | ORAL | 0 refills | Status: AC

## 2018-11-22 MED ORDER — CYCLOBENZAPRINE HCL 5 MG PO TABS: 5.0000 mg | ORAL_TABLET | Freq: Three times a day (TID) | ORAL | 1 refills | Status: AC | PRN

## 2018-11-22 NOTE — Patient Instructions (Signed)

## 2018-11-22 NOTE — Progress Notes (Signed)
Patient: Brandon Serrano Male    DOB: June 16, 1981   38 y.o.   MRN: [id number] Visit Date: 11/22/2018  Today's Provider: Mar Daring, PA-C e  Chief Complaint  Patient presents with  . Back Pain   Subjective:    I, Ovid Dimas, CMA, am acting as a Education administrator for [employer], PA-C.  HPI Back Pain: Patient presents for presents evaluation of low back problems.  Symptoms have been present for 2 days and include pain in lower back (aching, crushing, sharp and stabbing in character; 9/10 in severity) and weakness in lower back2. Initial inciting event: lifting heavy weight. Symptoms are worst: all day. Alleviating factors identifiable by patient are sitting. Exacerbating factors identifiable by patient are activity. Treatments so far initiated by patient: ibuprofen Previous lower back problems: yes. Previous workup: yes. Previous treatments: ibuprofen.   No Known Allergies  No current outpatient medications on file.  Review of Systems  Constitutional: Negative.   Respiratory: Negative.   Cardiovascular: Negative.   Musculoskeletal: Positive for back pain, gait problem and myalgias.  Neurological: Negative for weakness and numbness.    Social History   Tobacco Use  . Smoking status: Never Smoker  . Smokeless tobacco: Never Used  Substance Use Topics  . Alcohol use: Yes      Objective:   BP 136/82 (BP Location: Left Arm, Patient Position: Sitting, Cuff Size: Normal)   Pulse 82   Temp 98.4 F (36.9 C) (Oral)   Resp 16   Wt 235 lb (106.6 kg)   BMI 28.83 kg/m  Vitals:   11/22/18 1727  BP: 136/82  Pulse: 82  Resp: 16  Temp: 98.4 F (36.9 C)  TempSrc: Oral  Weight: 235 lb (106.6 kg)     Physical Exam Vitals signs reviewed.  Constitutional:      General: He is not in acute distress.    Appearance: Normal appearance. He is well-developed. He is not diaphoretic.  HENT:     Head: Normocephalic and atraumatic.  Neck:     Musculoskeletal: Normal  range of motion and neck supple.  Cardiovascular:     Rate and Rhythm: Normal rate and regular rhythm.     Heart sounds: Normal heart sounds. No murmur. No friction rub. No gallop.   Pulmonary:     Effort: Pulmonary effort is normal. No respiratory distress.     Breath sounds: Normal breath sounds. No wheezing or rales.  Musculoskeletal:     Lumbar back: He exhibits decreased range of motion (change of position worst), tenderness, pain and spasm. He exhibits no bony tenderness and no swelling.       Back:     Comments: SLR positive for right leg raise with symptoms radiating to the left side  Neurological:     Mental Status: He is alert.     PRIOR REPORT IMPORTED FROM THE [facility] WORKFLOW SYSTEM   REASON FOR EXAM:    back pain  COMMENTS:   PROCEDURE:     MR  - MR LUMBAR SPINE WO CONTRAST  - Oct 06 2011  9:48AM   RESULT:   Technique: Multiplanar and multisequence imaging of the lumbar spine was  obtained without the administration of gadolinium.   The conus medullaris terminates at an L1 level. The cauda equina  demonstrate  no evidence of clumping or thickening.   T12-L1, L1-L2, L2-L3 levels, there is no evidence of thecal sac stenosis  or  neuroforaminal narrowing.   At  the L3-L4 disc space level, a lobulated broad-based disc bulge with  focal  central protrusion is identified causing partial effacement of the  anterior  CSF space. There is no evidence of associated neuroforaminal narrowing.   At the L4-L5 disc space level, a broad-based disc bulge with a central  focus  of protrusion is appreciated causing near complete effacement of the  anterior CSF space with resulting severe thecal sac stenosis. There is no  evidence of neuroforaminal narrowing. There are findings concerning for  mass  effect upon the central aspect of the L5 nerve roots with findings  concerning for nerve root compromise and compression bilaterally.   At the L5-S1 level, a mild  central focal disc protrusion is appreciated  without significant thecal sac stenosis or neuroforaminal narrowing.   IMPRESSION:   1. Broad-based disc bulge with focal protrusion at the L4-L5 disc space  level with resulting severe thecal sac stenosis as well as findings  concerning for compression of the central aspect of the L5 nerve roots  bilaterally.  2. Milder disc bulges at L3-L4 and L5-S1 as described above without  significant thecal sac stenosis or neuroforaminal narrowing. Note,  encroachment upon the central S1 nerve roots at the L5-S1 level  bilaterally  is of diagnostic consideration.   Thank you for the opportunity to contribute to the care of your patient.        Assessment & Plan    1. Acute bilateral low back pain without sciatica Patient declined steroid (had adverse reactions). Will prescribe naproxen BID as below with flexeril. Heating pad and ice prn. Back exercises and stretches printed for patient. Call if not improving and will get xray and consider PT vs referral. - naproxen (NAPROSYN) 500 MG tablet; Take 1 tablet (500 mg total) by mouth 2 (two) times daily with a meal.  Dispense: 30 tablet; Refill: 0 - cyclobenzaprine (FLEXERIL) 5 MG tablet; Take 1 tablet (5 mg total) by mouth 3 (three) times daily as needed for muscle spasms.  Dispense: 30 tablet; Refill: [place], PA-C  [facility]

## 2018-11-23 ENCOUNTER — Telehealth: Payer: Self-pay | Admitting: Family Medicine

## 2018-11-23 NOTE — Telephone Encounter (Signed)
Please advise 

## 2018-11-23 NOTE — Telephone Encounter (Signed)
Pt said he was in for back pain yesterday and was given Flexeril 5 mg and Naproxen 500 mg  He said these were not helping that much and wants to know if he can take them more than recommened or get different rx  CB# [phone]    Thanks teri

## 2018-11-23 NOTE — Telephone Encounter (Signed)
May increase Flexeril 5 mg to 1 TID and 2 at bedtime. Continue Naproxen 500 mg BID and add a Salonpas Lidocaine patch applied to the pain area (OTC). Recheck in 5 days to see if further tests needed or physical therapy.

## 2018-11-23 NOTE — Telephone Encounter (Signed)
Patient was advised. Expressed understanding.  

## 2021-05-14 ENCOUNTER — Encounter: Payer: Managed Care, Other (non HMO) | Admitting: Family Medicine

## 2022-11-05 ENCOUNTER — Emergency Department
Admission: EM | Admit: 2022-11-05 | Discharge: 2022-11-05 | Disposition: A | Discharge status: Home / Self Care | Payer: Managed Care, Other (non HMO) | Attending: Emergency Medicine | Admitting: Emergency Medicine

## 2022-11-05 ENCOUNTER — Ambulatory Visit: Payer: Managed Care, Other (non HMO)

## 2022-11-05 ENCOUNTER — Emergency Department: Payer: Managed Care, Other (non HMO)

## 2022-11-05 ENCOUNTER — Ambulatory Visit
Admission: EM | Admit: 2022-11-05 | Discharge: 2022-11-05 | Disposition: A | Discharge status: Home / Self Care | Payer: Managed Care, Other (non HMO)

## 2022-11-05 ENCOUNTER — Ambulatory Visit: Payer: Self-pay

## 2022-11-05 ENCOUNTER — Other Ambulatory Visit: Payer: Self-pay

## 2022-11-05 DIAGNOSIS — Z23 Encounter for immunization: Secondary | ICD-10-CM | POA: Insufficient documentation

## 2022-11-05 DIAGNOSIS — L03012 Cellulitis of left finger: Secondary | ICD-10-CM

## 2022-11-05 DIAGNOSIS — M7989 Other specified soft tissue disorders: Secondary | ICD-10-CM | POA: Diagnosis present

## 2022-11-05 DIAGNOSIS — L02512 Cutaneous abscess of left hand: Secondary | ICD-10-CM | POA: Diagnosis not present

## 2022-11-05 DIAGNOSIS — M79645 Pain in left finger(s): Secondary | ICD-10-CM | POA: Insufficient documentation

## 2022-11-05 DIAGNOSIS — L089 Local infection of the skin and subcutaneous tissue, unspecified: Secondary | ICD-10-CM

## 2022-11-05 LAB — CBC WITH DIFFERENTIAL/PLATELET
Abs Immature Granulocytes: 0.02 10*3/uL (ref 0.00–0.07)
Basophils Absolute: 0 10*3/uL (ref 0.0–0.1)
Basophils Relative: 0 %
Eosinophils Absolute: 0.1 10*3/uL (ref 0.0–0.5)
Eosinophils Relative: 1 %
HCT: 42.9 % (ref 39.0–52.0)
Hemoglobin: 14.7 g/dL (ref 13.0–17.0)
Immature Granulocytes: 0 %
Lymphocytes Relative: 25 %
Lymphs Abs: 2.1 10*3/uL (ref 0.7–4.0)
MCH: 30.9 pg (ref 26.0–34.0)
MCHC: 34.3 g/dL (ref 30.0–36.0)
MCV: 90.1 fL (ref 80.0–100.0)
Monocytes Absolute: 0.7 10*3/uL (ref 0.1–1.0)
Monocytes Relative: 8 %
Neutro Abs: 5.4 10*3/uL (ref 1.7–7.7)
Neutrophils Relative %: 66 %
Platelets: 219 10*3/uL (ref 150–400)
RBC: 4.76 MIL/uL (ref 4.22–5.81)
RDW: 11.4 % — ABNORMAL LOW (ref 11.5–15.5)
WBC: 8.3 10*3/uL (ref 4.0–10.5)
nRBC: 0 % (ref 0.0–0.2)

## 2022-11-05 MED ORDER — TETANUS-DIPHTH-ACELL PERTUSSIS 5-2.5-18.5 LF-MCG/0.5 IM SUSY
0.5000 mL | PREFILLED_SYRINGE | Freq: Once | INTRAMUSCULAR | Status: AC
  Administered 2022-11-05: 0.5 mL via INTRAMUSCULAR
  Filled 2022-11-05: qty 0.5

## 2022-11-05 MED ORDER — PIPERACILLIN-TAZOBACTAM 3.375 G IVPB 30 MIN
3.3750 g | Freq: Once | INTRAVENOUS | Status: AC
  Administered 2022-11-05: 3.375 g via INTRAVENOUS
  Filled 2022-11-05: qty 50

## 2022-11-05 MED ORDER — LIDOCAINE HCL (PF) 1 % IJ SOLN
5.0000 mL | Freq: Once | INTRAMUSCULAR | Status: AC
  Administered 2022-11-05: 5 mL via INTRADERMAL
  Filled 2022-11-05: qty 5

## 2022-11-05 MED ORDER — CEPHALEXIN 500 MG PO CAPS: 500.0000 mg | ORAL_CAPSULE | Freq: Four times a day (QID) | ORAL | 0 refills | Status: AC

## 2022-11-05 NOTE — ED Triage Notes (Signed)
Patient to Urgent Care with complaints of left index finger pain. Reports possibly retrained metal shavings present in his finger from a screw. Describes throbbing pain. Swelling to finger tip.   Patient has been cleaning the area and dressing with a band-aid-aid/ also applying abx ointment.

## 2022-11-05 NOTE — ED Notes (Signed)
Finger bandaged and wrapped.

## 2022-11-05 NOTE — ED Provider Notes (Signed)
[facility] Provider Note    Event Date/Time   First MD Initiated Contact with Patient 11/05/22 1309     (approximate)   History   Finger Injury   HPI  Brandon Serrano is a 42 y.o. male with no past medical history presents emergency department with a concern for an infection of the left index finger.  Patient was using a drill 6 days ago and thinks he may have gotten metal or wood inside the wound.  States he applied a Band-Aid and antibiotic ointment.  Noticed yesterday that the area became red and swollen.  Went to urgent care who sent him here.  Last tetanus was in 2008 he denies fever or chills      Physical Exam   Triage Vital Signs: ED Triage Vitals  Enc Vitals Group     BP 11/05/22 1234 (!) 138/98     Pulse Rate 11/05/22 1234 78     Resp 11/05/22 1234 16     Temp 11/05/22 1234 97.8 F (36.6 C)     Temp Source 11/05/22 1234 Oral     SpO2 11/05/22 1234 99 %     Weight 11/05/22 1235 215 lb (97.5 kg)     Height 11/05/22 1235 '6\' 3"'$  (1.905 m)     Head Circumference --      Peak Flow --      Pain Score 11/05/22 1235 2     Pain Loc --      Pain Edu? --      Excl. in [place]? --     Most recent vital signs: Vitals:   11/05/22 1234  BP: (!) 138/98  Pulse: 78  Resp: 16  Temp: 97.8 F (36.6 C)  SpO2: 99%     General: Awake, no distress.   CV:  Good peripheral perfusion. regular rate and  rhythm Resp:  Normal effort.  Abd:  No distention.   Other:  Left hand with red swollen finger, patient does still have range of motion but states that the finger feels very full, neurovascular is intact   ED Results / Procedures / Treatments   Labs (all labs ordered are listed, but only abnormal results are displayed) Labs Reviewed  CBC WITH DIFFERENTIAL/PLATELET - Abnormal; Notable for the following components:      Result Value   RDW 11.4 (*)    All other components within normal limits     EKG     RADIOLOGY X-ray of the left  hand    PROCEDURES:   .Marland KitchenIncision and Drainage  Date/Time: 11/05/2022 2:47 PM  Performed by: Versie Starks, PA-C Authorized by: Versie Starks, PA-C   Consent:    Consent obtained:  Verbal   Consent given by:  Patient   Risks, benefits, and alternatives were discussed: yes     Risks discussed:  Bleeding, incomplete drainage, pain, damage to other organs and infection   Alternatives discussed:  Delayed treatment Universal protocol:    Procedure explained and questions answered to patient or proxy's satisfaction: yes     Immediately prior to procedure, a time out was called: yes     Patient identity confirmed:  Verbally with patient Location:    Type:  Abscess   Location:  Upper extremity   Upper extremity location:  Finger   Finger location:  L index finger Pre-procedure details:    Skin preparation:  Povidone-iodine Sedation:    Sedation type:  None Anesthesia:    Anesthesia method:  Nerve block   Block anesthetic:  Lidocaine 1% w/o epi   Block injection procedure:  Anatomic landmarks identified, introduced needle, incremental injection, anatomic landmarks palpated and negative aspiration for blood   Block outcome:  Anesthesia achieved Procedure type:    Complexity:  Simple Procedure details:    Incision types:  Stab incision   Drainage:  Bloody   Drainage amount:  Scant   Wound treatment:  Wound left open Post-procedure details:    Procedure completion:  Tolerated well, no immediate complications    MEDICATIONS ORDERED IN ED: Medications  Tdap (BOOSTRIX) injection 0.5 mL (0.5 mLs Intramuscular Given 11/05/22 1352)  piperacillin-tazobactam (ZOSYN) IVPB 3.375 g (0 g Intravenous Stopped 11/05/22 1425)  lidocaine (PF) (XYLOCAINE) 1 % injection 5 mL (5 mLs Intradermal Given 11/05/22 1432)     IMPRESSION / MDM / ASSESSMENT AND PLAN / ED COURSE  I reviewed the triage vital signs and the nursing notes.                              Differential diagnosis includes,  but is not limited to, felon, cellulitis, foreign body, abscess, wound infection  Patient's presentation is most consistent with acute presentation with potential threat to life or bodily function.   X-ray of the left index finger CBC, Zosyn IV  CBC is reassuring X-ray of the left index finger reviewed by me, radiologist read interpreted by me as meaning   Patient was given a prescription for Keflex 500 4 times daily.  He is to follow-up with [facility] if not improving in 1 week.  Return emergency department if worsening.  He was given instructions on how to clean the wound.  Soak in warm water with Epsom salts.  He is in agreement treatment plan.  He was discharged stable condition.  Tdap was updated in the ED   FINAL CLINICAL IMPRESSION(S) / ED DIAGNOSES   Final diagnoses:  Finger infection     Rx / DC Orders   ED Discharge Orders          Ordered    cephALEXin (KEFLEX) 500 MG capsule  4 times daily        11/05/22 1416             Note:  This document was prepared using Dragon voice recognition software and may include unintentional dictation errors.    Versie Starks, PA-C 11/05/22 1449    Rada Hay, MD 11/05/22 [phone]

## 2022-11-05 NOTE — Discharge Instructions (Signed)
Wash the area with soap and water daily, soak in warm salt water.  Take your antibiotic as prescribed.  Return emergency department if worsening

## 2022-11-05 NOTE — Discharge Instructions (Signed)
Go to the emergency department for evaluation of your finger infection and possible foreign body.

## 2022-11-05 NOTE — ED Provider Notes (Signed)
Brandon Serrano    CSN: [id number] Arrival date & time: 11/05/22  1049      History   Chief Complaint Chief Complaint  Patient presents with   Finger Injury    Possible metal in finger - Entered by patient    HPI Brandon Serrano is a 42 y.o. male.  Patient presents with pain, redness, swelling of his left index finger.  His symptoms started on 10/30/2022 when he accidentally hit his finger with a metal drill.  He is concerned for possible retained metal shavings.  He denies fever, chills, wound drainage, or other symptoms.  Treating with antibiotic ointment.  Last tetanus unknown.    The history is provided by the patient and medical records.    History reviewed. No pertinent past medical history.  Patient Active Problem List   Diagnosis Date Noted   Finger pain, left 11/05/2022    Past Surgical History:  Procedure Laterality Date   VASECTOMY  11/2010       Home Medications    Prior to Admission medications   Medication Sig Start Date End Date Taking? Authorizing Provider  cyclobenzaprine (FLEXERIL) 5 MG tablet Take 1 tablet (5 mg total) by mouth 3 (three) times daily as needed for muscle spasms. Patient not taking: Reported on 11/05/2022 11/22/18   Mar Daring, PA-C  naproxen (NAPROSYN) 500 MG tablet Take 1 tablet (500 mg total) by mouth 2 (two) times daily with a meal. Patient not taking: Reported on 11/05/2022 11/22/18   Mar Daring, PA-C    Family History History reviewed. No pertinent family history.  Social History Social History   Tobacco Use   Smoking status: Never   Smokeless tobacco: Never  Substance Use Topics   Alcohol use: Yes   Drug use: No     Allergies   Patient has no known allergies.   Review of Systems Review of Systems  Constitutional:  Negative for chills and fever.  Skin:  Positive for color change and wound.  Neurological:  Negative for weakness and numbness.  All other systems reviewed and are  negative.    Physical Exam Triage Vital Signs ED Triage Vitals  Enc Vitals Group     BP      Pulse      Resp      Temp      Temp src      SpO2      Weight      Height      Head Circumference      Peak Flow      Pain Score      Pain Loc      Pain Edu?      Excl. in [place]?    No data found.  Updated Vital Signs BP 123/88   Pulse 69   Temp 98.6 F (37 C)   Resp 18   SpO2 99%   Visual Acuity Right Eye Distance:   Left Eye Distance:   Bilateral Distance:    Right Eye Near:   Left Eye Near:    Bilateral Near:     Physical Exam Vitals and nursing note reviewed.  Constitutional:      General: He is not in acute distress.    Appearance: Normal appearance. He is well-developed. He is not ill-appearing.  HENT:     Mouth/Throat:     Mouth: Mucous membranes are moist.  Cardiovascular:     Rate and Rhythm: Normal rate and regular rhythm.  Pulmonary:     Effort: Pulmonary effort is normal. No respiratory distress.  Musculoskeletal:        General: Swelling and tenderness present. Normal range of motion.     Cervical back: Neck supple.  Skin:    General: Skin is warm and dry.     Capillary Refill: Capillary refill takes less than 2 seconds.     Findings: Erythema and lesion present.     Comments: Left index finger tender, edematous, erythematous from DIP to tip, wound on palmar side.  See pictures.   Neurological:     General: No focal deficit present.     Mental Status: He is alert and oriented to person, place, and time.     Sensory: No sensory deficit.     Motor: No weakness.  Psychiatric:        Mood and Affect: Mood normal.        Behavior: Behavior normal.         UC Treatments / Results  Labs (all labs ordered are listed, but only abnormal results are displayed) Labs Reviewed - No data to display  EKG   Radiology No results found.  Procedures Procedures (including critical care time)  Medications Ordered in UC Medications - No data to  display  Initial Impression / Assessment and Plan / UC Course  I have reviewed the triage vital signs and the nursing notes.  Pertinent labs & imaging results that were available during my care of the patient were reviewed by me and considered in my medical decision making (see chart for details).    Cellulitis of left index finger.  Given the sharp line of erythema at the DIP of finger, I am concerned for the possibility of felon finger.  Sending him to the ED for evaluation.  He is agreeable to this and will drive himself to the ED.   Final Clinical Impressions(s) / UC Diagnoses   Final diagnoses:  Cellulitis of finger of left hand     Discharge Instructions      Go to the emergency department for evaluation of your finger infection and possible foreign body.       ED Prescriptions   None    PDMP not reviewed this encounter.   Sharion Balloon, NP 11/05/22 [phone]

## 2022-11-05 NOTE — Telephone Encounter (Signed)
     Chief Complaint: Left pointer finger Symptoms: Pain,swelling Frequency: Saturday, working with metal Pertinent Negatives: Patient denies fever Disposition: []$ ED /[x]$ Urgent Care (no appt availability in office) / []$ Appointment(In office/virtual)/ []$  [facility]/ []$ Home Care/ []$ Refused Recommended Disposition /[]$  Mobile Bus/ []$  Follow-up with PCP Additional Notes: Needs to establish care. Will call back.  Reason for Disposition  [1] Looks infected (spreading redness, red streak, pus) AND [2] large red area (> 2 inches or 5 cm, or entire finger)  Answer Assessment - Initial Assessment Questions 1. ONSET: "When did the pain start?"      Saturday 2. LOCATION and RADIATION: "Where is the pain located?"  (e.g., fingertip, around nail, joint, entire  finger)      Left pointer 3. SEVERITY: "How bad is the pain?" "What does it keep you from doing?"   (Scale 1-10; or mild, moderate, severe)  - MILD (1-3): doesn't interfere with normal activities.   - MODERATE (4-7): interferes with normal activities or awakens from sleep.  - SEVERE (8-10): excruciating pain, unable to hold a glass of water or bend finger even a little.     Moderate 4. APPEARANCE: "What does the finger look like?" (e.g., redness, swelling, bruising, pallor)     Throbbing  5. WORK OR EXERCISE: "Has there been any recent work or exercise that involved this part (i.e., fingers or hand) of the body?"     No 6. CAUSE: "What do you think is causing the pain?"     Yes 7. AGGRAVATING FACTORS: "What makes the pain worse?" (e.g., using computer)     Using 8. OTHER SYMPTOMS: "Do you have any other symptoms?" (e.g., fever, neck pain, numbness)     Swelling 9. PREGNANCY: "Is there any chance you are pregnant?" "When was your last menstrual period?"     N/a  Protocols used: Finger Pain-A-AH

## 2022-11-05 NOTE — ED Triage Notes (Addendum)
Patient cut index finger on left hand using drill and bit 6 days ago, has had dressings and abx ointment on it since then and yesterday started to become red and swollen. Sent from UC today. States last tetanus 2008.
